# Patient Record
Sex: Female | Born: 1950 | Race: White | Hispanic: No | Smoking: Never smoker
Health system: Southern US, Community
[De-identification: ages and names within clinical notes are randomized; demographics above are authoritative.]

## PROBLEM LIST (undated history)

## (undated) DIAGNOSIS — D649 Anemia, unspecified: Secondary | ICD-10-CM

## (undated) DIAGNOSIS — D696 Thrombocytopenia, unspecified: Secondary | ICD-10-CM

## (undated) DIAGNOSIS — E119 Type 2 diabetes mellitus without complications: Secondary | ICD-10-CM

## (undated) DIAGNOSIS — S22000A Wedge compression fracture of unspecified thoracic vertebra, initial encounter for closed fracture: Secondary | ICD-10-CM

## (undated) DIAGNOSIS — M1990 Unspecified osteoarthritis, unspecified site: Secondary | ICD-10-CM

## (undated) DIAGNOSIS — M810 Age-related osteoporosis without current pathological fracture: Secondary | ICD-10-CM

---

## 2016-08-03 ENCOUNTER — Emergency Department (HOSPITAL_COMMUNITY): Payer: Self-pay

## 2016-08-03 ENCOUNTER — Observation Stay (HOSPITAL_COMMUNITY)
Admission: EM | Admit: 2016-08-03 | Discharge: 2016-08-04 | Disposition: A | Discharge status: Home / Self Care | Payer: Self-pay | Attending: Family Medicine | Admitting: Family Medicine

## 2016-08-03 ENCOUNTER — Encounter (HOSPITAL_COMMUNITY): Payer: Self-pay | Admitting: Emergency Medicine

## 2016-08-03 DIAGNOSIS — Z79899 Other long term (current) drug therapy: Secondary | ICD-10-CM | POA: Insufficient documentation

## 2016-08-03 DIAGNOSIS — D509 Iron deficiency anemia, unspecified: Secondary | ICD-10-CM | POA: Insufficient documentation

## 2016-08-03 DIAGNOSIS — M810 Age-related osteoporosis without current pathological fracture: Secondary | ICD-10-CM

## 2016-08-03 DIAGNOSIS — E119 Type 2 diabetes mellitus without complications: Secondary | ICD-10-CM

## 2016-08-03 DIAGNOSIS — R42 Dizziness and giddiness: Secondary | ICD-10-CM

## 2016-08-03 DIAGNOSIS — E538 Deficiency of other specified B group vitamins: Secondary | ICD-10-CM | POA: Insufficient documentation

## 2016-08-03 DIAGNOSIS — M1990 Unspecified osteoarthritis, unspecified site: Secondary | ICD-10-CM | POA: Insufficient documentation

## 2016-08-03 DIAGNOSIS — M4854XA Collapsed vertebra, not elsewhere classified, thoracic region, initial encounter for fracture: Secondary | ICD-10-CM

## 2016-08-03 DIAGNOSIS — Z7952 Long term (current) use of systemic steroids: Secondary | ICD-10-CM | POA: Insufficient documentation

## 2016-08-03 DIAGNOSIS — K219 Gastro-esophageal reflux disease without esophagitis: Secondary | ICD-10-CM | POA: Insufficient documentation

## 2016-08-03 DIAGNOSIS — D693 Immune thrombocytopenic purpura: Secondary | ICD-10-CM | POA: Insufficient documentation

## 2016-08-03 DIAGNOSIS — R51 Headache: Secondary | ICD-10-CM | POA: Insufficient documentation

## 2016-08-03 DIAGNOSIS — Z7984 Long term (current) use of oral hypoglycemic drugs: Secondary | ICD-10-CM | POA: Insufficient documentation

## 2016-08-03 DIAGNOSIS — R079 Chest pain, unspecified: Principal | ICD-10-CM | POA: Diagnosis present

## 2016-08-03 DIAGNOSIS — M659 Synovitis and tenosynovitis, unspecified: Secondary | ICD-10-CM

## 2016-08-03 DIAGNOSIS — M65949 Unspecified synovitis and tenosynovitis, unspecified hand: Secondary | ICD-10-CM

## 2016-08-03 DIAGNOSIS — D696 Thrombocytopenia, unspecified: Secondary | ICD-10-CM

## 2016-08-03 DIAGNOSIS — D649 Anemia, unspecified: Secondary | ICD-10-CM

## 2016-08-03 HISTORY — DX: Wedge compression fracture of unspecified thoracic vertebra, initial encounter for closed fracture: S22.000A

## 2016-08-03 HISTORY — DX: Anemia, unspecified: D64.9

## 2016-08-03 HISTORY — DX: Unspecified osteoarthritis, unspecified site: M19.90

## 2016-08-03 HISTORY — DX: Thrombocytopenia, unspecified: D69.6

## 2016-08-03 HISTORY — DX: Age-related osteoporosis without current pathological fracture: M81.0

## 2016-08-03 HISTORY — DX: Type 2 diabetes mellitus without complications: E11.9

## 2016-08-03 LAB — URINALYSIS, ROUTINE W REFLEX MICROSCOPIC
BILIRUBIN URINE: NEGATIVE
GLUCOSE, UA: NEGATIVE mg/dL
HGB URINE DIPSTICK: NEGATIVE
Ketones, ur: NEGATIVE mg/dL
Leukocytes, UA: NEGATIVE
Nitrite: NEGATIVE
PROTEIN: NEGATIVE mg/dL
Specific Gravity, Urine: 1.006 (ref 1.005–1.030)
pH: 8 (ref 5.0–8.0)

## 2016-08-03 LAB — BASIC METABOLIC PANEL
Anion gap: 10 (ref 5–15)
BUN: 12 mg/dL (ref 6–20)
CALCIUM: 9.4 mg/dL (ref 8.9–10.3)
CHLORIDE: 102 mmol/L (ref 101–111)
CO2: 23 mmol/L (ref 22–32)
CREATININE: 0.59 mg/dL (ref 0.44–1.00)
GFR calc Af Amer: >60 mL/min (ref >60)
GFR calc non Af Amer: >60 mL/min (ref >60)
Glucose, Bld: 119 mg/dL — ABNORMAL HIGH (ref 65–99)
Potassium: 4 mmol/L (ref 3.5–5.1)
Sodium: 135 mmol/L (ref 135–145)

## 2016-08-03 LAB — CBC
HCT: 36.5 % (ref 36.0–46.0)
Hemoglobin: 11.4 g/dL — ABNORMAL LOW (ref 12.0–15.0)
MCH: 22.4 pg — AB (ref 26.0–34.0)
MCHC: 31.2 g/dL (ref 30.0–36.0)
MCV: 71.9 fL — AB (ref 78.0–100.0)
PLATELETS: 102 10*3/uL — AB (ref 150–400)
RBC: 5.08 MIL/uL (ref 3.87–5.11)
RDW: 17.1 % — AB (ref 11.5–15.5)
WBC: 8.2 10*3/uL (ref 4.0–10.5)

## 2016-08-03 IMAGING — DX DG CHEST 2V
2 series · 2 of 2 positions shown · non-contrast
Comparison: None.

CLINICAL DATA: Chest pain nausea and dizziness

EXAM:
CHEST  2 VIEW

[chest pa]
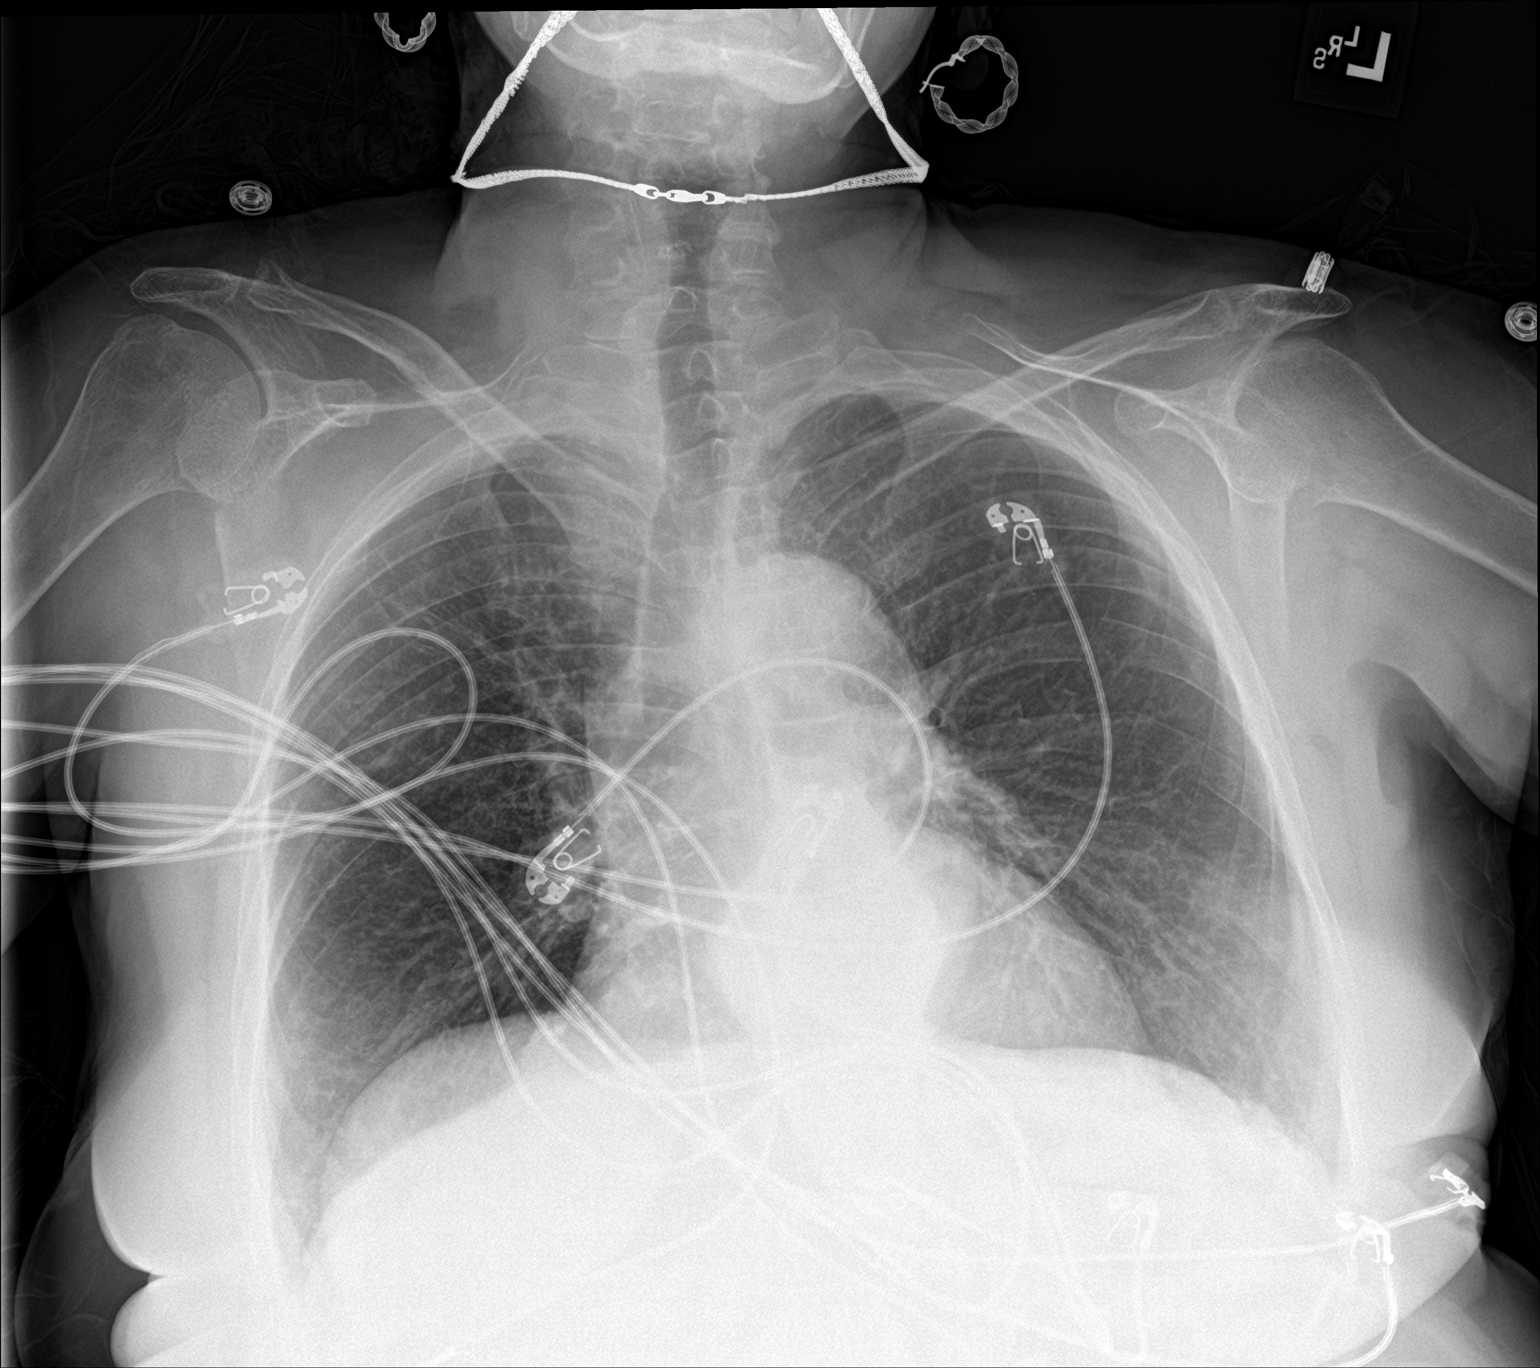

[chest lat]
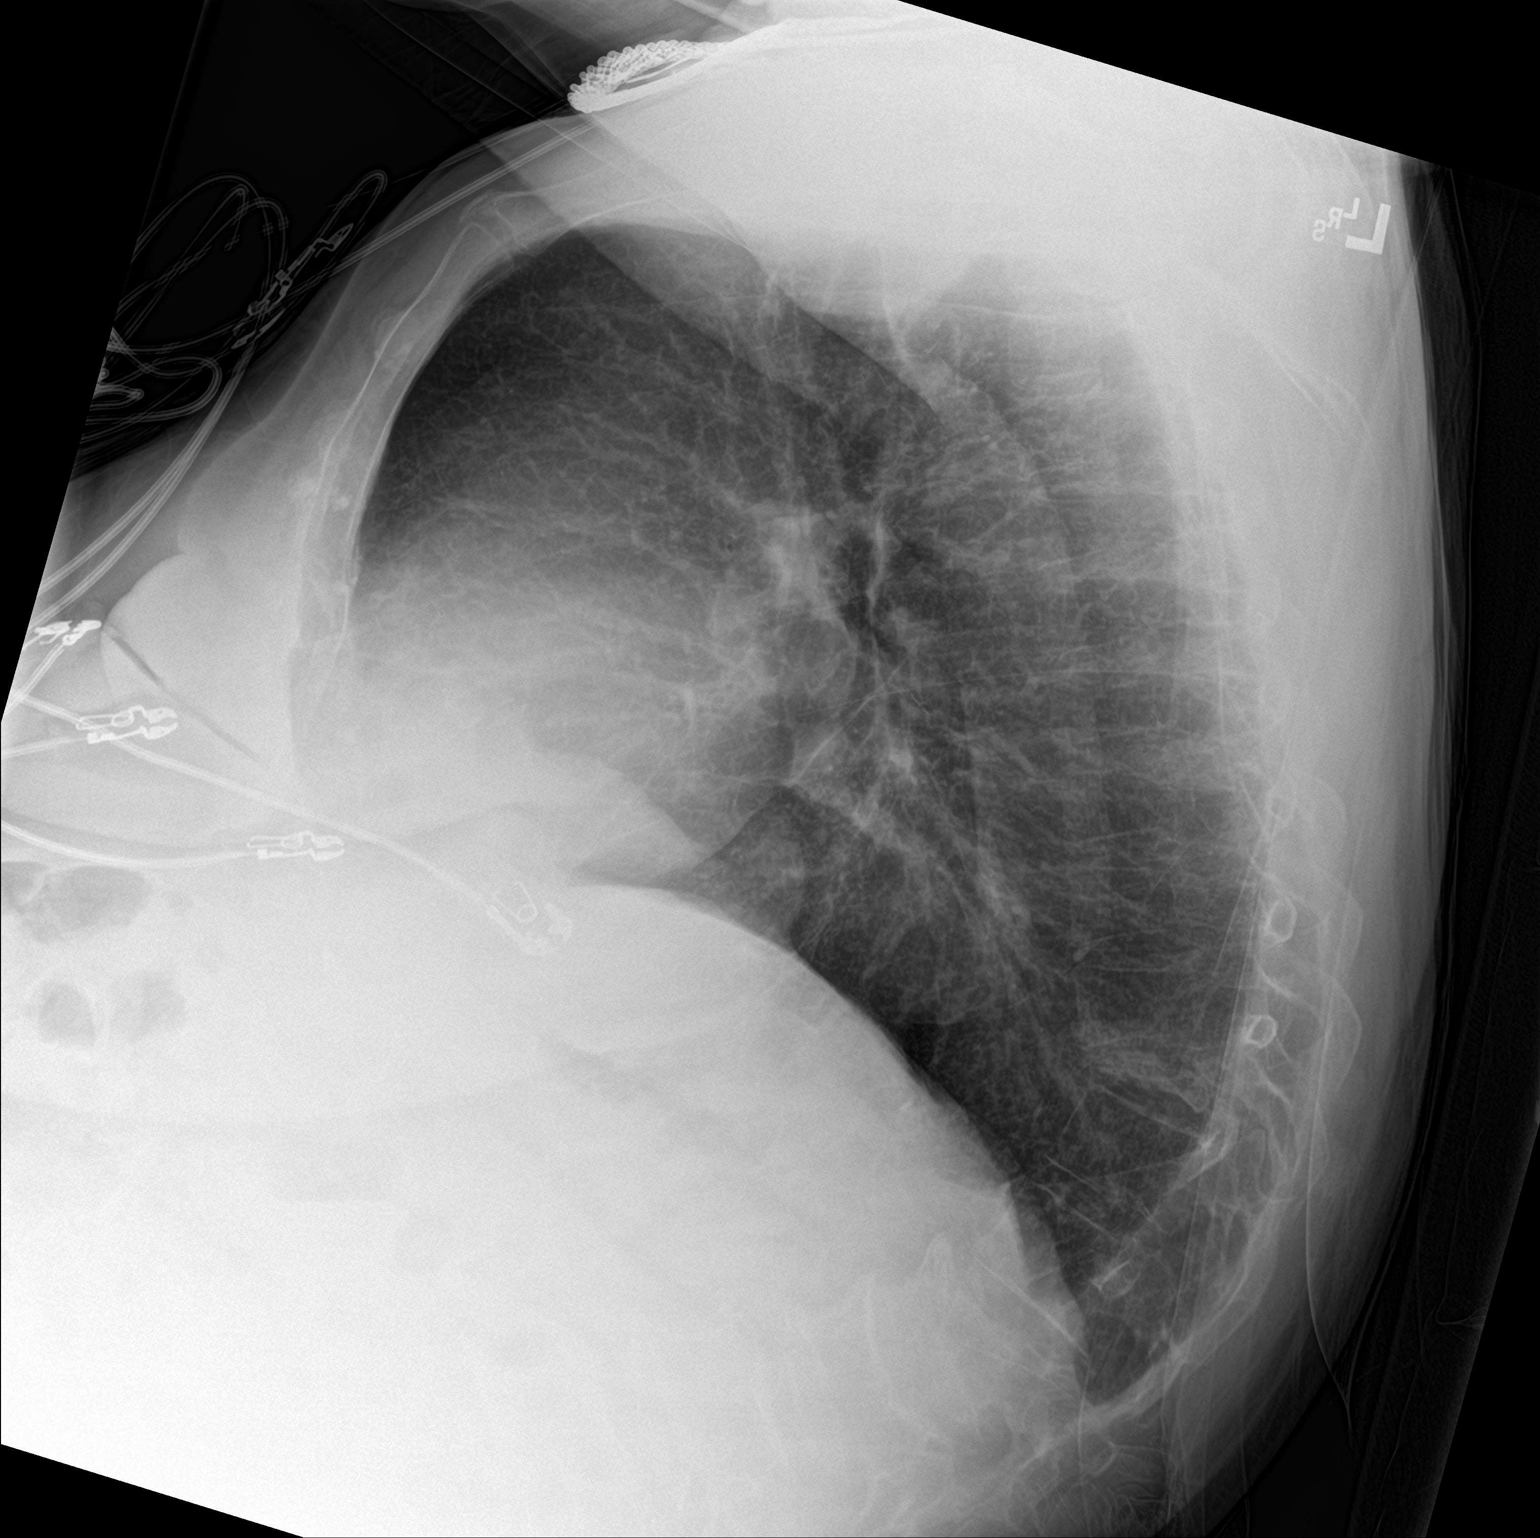

[2 of 2 positions shown; findings below may reference images not displayed]

FINDINGS: Mildly low lung volumes. No focal consolidation or pleural effusion.
Heart size upper normal. Mildly tortuous aorta with atherosclerosis.
No pneumothorax. Possible of hiatal hernia.

Osteopenia. Kyphosis of the spine with severe compression fracture
of lower thoracic vertebra and multiple compression deformities of
upper lumbar vertebra.
IMPRESSION: 1. No acute infiltrate or edema
2. Osteopenia with multiple moderate severe compression fractures of
the lower thoracic and upper lumbar spine.

## 2016-08-03 MED ORDER — ACETAMINOPHEN 500 MG PO TABS
1000.0000 mg | ORAL_TABLET | Freq: Once | ORAL | Status: AC
  Administered 2016-08-03: 1000 mg via ORAL
  Filled 2016-08-03: qty 2

## 2016-08-03 MED ORDER — NITROGLYCERIN 2 % TD OINT
1.0000 [in_us] | TOPICAL_OINTMENT | Freq: Once | TRANSDERMAL | Status: AC
  Administered 2016-08-04: 1 [in_us] via TOPICAL
  Filled 2016-08-03: qty 1

## 2016-08-03 NOTE — ED Provider Notes (Addendum)
[facility]-EMERGENCY DEPT Provider Note   CSN: [id number] Arrival date & time: 08/03/16  1856     History   Chief Complaint Chief Complaint  Patient presents with  . Chest Pain  . Emesis  . Dizziness    HPI Kim Larson is a 66 y.o. female.  The history is provided by the patient.  Chest Pain   This is a recurrent problem. The current episode started 6 to 12 hours ago. Episode frequency: intermittent. The problem has not changed since onset.The pain is present in the substernal region. The pain is moderate. The quality of the pain is described as pressure-like. The pain does not radiate. Associated symptoms include dizziness, malaise/fatigue, nausea and shortness of breath. Pertinent negatives include no abdominal pain, no back pain, no cough, no fever, no irregular heartbeat and no lower extremity edema. She has tried nothing for the symptoms. Risk factors include obesity.  Her past medical history is significant for diabetes.  Pertinent negatives for past medical history include no CAD, no DVT, no hyperlipidemia, no hypertension, no MI, no PE and no strokes.    Past Medical History:  Diagnosis Date  . Diabetes mellitus without complication (HCC)     There are no active problems to display for this patient.   History reviewed. No pertinent surgical history.  OB History    No data available       Home Medications    Prior to Admission medications   Medication Sig Start Date End Date Taking? Authorizing Provider  calcium carbonate (TUMS - DOSED IN MG ELEMENTAL CALCIUM) 500 MG chewable tablet Chew 1 tablet by mouth 2 (two) times daily.   Yes [provider]  GLIMEPIRIDE PO Take 1.5 mg by mouth daily.   Yes [provider]  metFORMIN (GLUCOPHAGE) 850 MG tablet Take 850 mg by mouth 2 (two) times daily with a meal.   Yes [provider]  omeprazole (PRILOSEC) 20 MG capsule Take 20 mg by mouth daily.   Yes [provider]  predniSONE  (DELTASONE) 5 MG tablet Take 5 mg by mouth daily with breakfast.   Yes [provider]  PRESCRIPTION MEDICATION Take 5 mLs by mouth 2 (two) times daily. [place] 15%   Yes [provider]    Family History History reviewed. No pertinent family history.  Social History Social History  Substance Use Topics  . Smoking status: Never Smoker  . Smokeless tobacco: Never Used  . Alcohol use No     Allergies   Patient has no known allergies.   Review of Systems Review of Systems  Constitutional: Positive for malaise/fatigue. Negative for fever.  Respiratory: Positive for shortness of breath. Negative for cough.   Cardiovascular: Positive for chest pain.  Gastrointestinal: Positive for nausea. Negative for abdominal pain.  Musculoskeletal: Negative for back pain.  Neurological: Positive for dizziness.   All other systems are reviewed and are negative for acute change except as noted in the HPI   Physical Exam Updated Vital Signs BP 135/69   Pulse 78   Temp 99.6 F (37.6 C) (Oral)   Resp (!) 21   SpO2 96%   Physical Exam  Constitutional: She is oriented to person, place, and time. She appears well-developed and well-nourished. No distress.  HENT:  Head: Normocephalic and atraumatic.  Nose: Nose normal.  Eyes: Conjunctivae and EOM are normal. Pupils are equal, round, and reactive to light. Right eye exhibits no discharge. Left eye exhibits no discharge. No scleral icterus.  Neck:  Normal range of motion. Neck supple.  Cardiovascular: Normal rate and regular rhythm.  Exam reveals no gallop and no friction rub.   No murmur heard. Pulmonary/Chest: Effort normal and breath sounds normal. No stridor. No respiratory distress. She has no rales.  Abdominal: Soft. She exhibits no distension. There is no tenderness.  Musculoskeletal: She exhibits no edema or tenderness.  Neurological: She is alert and oriented to person, place, and time.  Skin: Skin is warm and dry. No  rash noted. She is not diaphoretic. No erythema.  Psychiatric: She has a normal mood and affect.  Vitals reviewed.    ED Treatments / Results  Labs (all labs ordered are listed, but only abnormal results are displayed) Labs Reviewed  BASIC METABOLIC PANEL - Abnormal; Notable for the following:       Result Value   Glucose, Bld 119 (*)    All other components within normal limits  CBC - Abnormal; Notable for the following:    Hemoglobin 11.4 (*)    MCV 71.9 (*)    MCH 22.4 (*)    RDW 17.1 (*)    Platelets 102 (*)    All other components within normal limits  URINALYSIS, ROUTINE W REFLEX MICROSCOPIC - Abnormal; Notable for the following:    Color, Urine STRAW (*)    All other components within normal limits  D-DIMER, QUANTITATIVE (NOT AT [facility])  CBG MONITORING, ED  Rosezena Sensor, ED    EKG  EKG Interpretation  Date/Time:  Tuesday Aug 03 2016 19:34:32 EDT Ventricular Rate:  106 PR Interval:  162 QRS Duration: 106 QT Interval:  342 QTC Calculation: 454 R Axis:   -74 Text Interpretation:  Sinus tachycardia Incomplete right bundle branch block Left anterior fascicular block Septal infarct , age undetermined Abnormal ECG NO STEMI   no prior tracing  Confirmed by [facility] MD, Elzie Knisley [phone]) on 08/03/2016 10:42:21 PM       Radiology Dg Chest 2 View  Result Date: 08/03/2016 CLINICAL DATA:  Chest pain nausea and dizziness EXAM: CHEST  2 VIEW COMPARISON:  None. FINDINGS: Mildly low lung volumes. No focal consolidation or pleural effusion. Heart size upper normal. Mildly tortuous aorta with atherosclerosis. No pneumothorax. Possible of hiatal hernia. Osteopenia. Kyphosis of the spine with severe compression fracture of lower thoracic vertebra and multiple compression deformities of upper lumbar vertebra. IMPRESSION: 1. No acute infiltrate or edema 2. Osteopenia with multiple moderate severe compression fractures of the lower thoracic and upper lumbar spine. Electronically Signed    By: Jasmine Pang M.D.   On: 08/03/2016 23:26    Procedures Procedures (including critical care time)  Medications Ordered in ED Medications  acetaminophen (TYLENOL) tablet 1,000 mg (not administered)     Initial Impression / Assessment and Plan / ED Course  I have reviewed the triage vital signs and the nursing notes.  Pertinent labs & imaging results that were available during my care of the patient were reviewed by me and considered in my medical decision making (see chart for details).     Atypical chest pain. EKG without acute ischemic changes or evidence of pericarditis. Patient does have nonspecific, age indeterminate septal changes. No prior EKG for comparison.  HEAR >3. Initial troponin negative. She will require admission for ACS rule out if other work up is negative. Provided with NTG. 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 Holding heparin for the time being.   Unable to [facility] due to tachycardia with right axis changes, recent  trip, and age. But low pretest probability. Dimer negative. Doubt PE.  Chest x-ray without evidence suggestive of pneumonia, pneumothorax, pneumomediastinum.  No abnormal contour of the mediastinum to suggest dissection. No evidence of acute injuries.  Presentation not classic for aortic dissection or esophageal perforation.  Will discuss case with hospitalist for admission.  Final Clinical Impressions(s) / ED Diagnoses   Final diagnoses:  Chest pain      Natonya Finstad, Amadeo Garnet, MD 08/04/16 0040

## 2016-08-03 NOTE — ED Triage Notes (Signed)
Pt presents with dizziness and emesis for last couple days and worse today; pt denies abd pain but reports generalized body aches, "achy bones"; pt denies diarrhea; denies syncope; pt reports room spinning, seeing double dizziness

## 2016-08-04 ENCOUNTER — Encounter (HOSPITAL_COMMUNITY): Payer: Self-pay | Admitting: Internal Medicine

## 2016-08-04 ENCOUNTER — Observation Stay (HOSPITAL_COMMUNITY): Payer: Self-pay

## 2016-08-04 DIAGNOSIS — R079 Chest pain, unspecified: Secondary | ICD-10-CM | POA: Diagnosis present

## 2016-08-04 DIAGNOSIS — D696 Thrombocytopenia, unspecified: Secondary | ICD-10-CM

## 2016-08-04 DIAGNOSIS — R42 Dizziness and giddiness: Secondary | ICD-10-CM

## 2016-08-04 DIAGNOSIS — E119 Type 2 diabetes mellitus without complications: Secondary | ICD-10-CM

## 2016-08-04 DIAGNOSIS — M4854XA Collapsed vertebra, not elsewhere classified, thoracic region, initial encounter for fracture: Secondary | ICD-10-CM

## 2016-08-04 DIAGNOSIS — M659 Synovitis and tenosynovitis, unspecified: Secondary | ICD-10-CM

## 2016-08-04 DIAGNOSIS — M810 Age-related osteoporosis without current pathological fracture: Secondary | ICD-10-CM

## 2016-08-04 DIAGNOSIS — D649 Anemia, unspecified: Secondary | ICD-10-CM

## 2016-08-04 LAB — TROPONIN I: Troponin I: <0.03 ng/mL (ref <0.03)

## 2016-08-04 LAB — HEPATIC FUNCTION PANEL
ALBUMIN: 3.6 g/dL (ref 3.5–5.0)
ALK PHOS: 46 U/L (ref 38–126)
ALT: 13 U/L — AB (ref 14–54)
AST: 20 U/L (ref 15–41)
Bilirubin, Direct: 0.1 mg/dL (ref 0.1–0.5)
Indirect Bilirubin: 0.5 mg/dL (ref 0.3–0.9)
TOTAL PROTEIN: 6.6 g/dL (ref 6.5–8.1)
Total Bilirubin: 0.6 mg/dL (ref 0.3–1.2)

## 2016-08-04 LAB — LIPID PANEL
Cholesterol: 143 mg/dL (ref 0–200)
HDL: 37 mg/dL — AB (ref >40)
LDL CALC: 81 mg/dL (ref 0–99)
Total CHOL/HDL Ratio: 3.9 RATIO
Triglycerides: 123 mg/dL (ref <150)
VLDL: 25 mg/dL (ref 0–40)

## 2016-08-04 LAB — TSH: TSH: 0.774 u[IU]/mL (ref 0.350–4.500)

## 2016-08-04 LAB — GLUCOSE, CAPILLARY: GLUCOSE-CAPILLARY: 121 mg/dL — AB (ref 65–99)

## 2016-08-04 LAB — IRON AND TIBC
Iron: 25 ug/dL — ABNORMAL LOW (ref 28–170)
Saturation Ratios: 7 % — ABNORMAL LOW (ref 10.4–31.8)
TIBC: 381 ug/dL (ref 250–450)
UIBC: 356 ug/dL

## 2016-08-04 LAB — CBG MONITORING, ED: Glucose-Capillary: 94 mg/dL (ref 65–99)

## 2016-08-04 LAB — FOLATE: FOLATE: 20 ng/mL (ref >5.9)

## 2016-08-04 LAB — C-REACTIVE PROTEIN: CRP: <0.8 mg/dL (ref <1.0)

## 2016-08-04 LAB — D-DIMER, QUANTITATIVE: D-Dimer, Quant: 0.45 ug/mL-FEU (ref 0.00–0.50)

## 2016-08-04 LAB — RETICULOCYTES
RBC.: 4.91 MIL/uL (ref 3.87–5.11)
RETIC COUNT ABSOLUTE: 44.2 10*3/uL (ref 19.0–186.0)
Retic Ct Pct: 0.9 % (ref 0.4–3.1)

## 2016-08-04 LAB — I-STAT TROPONIN, ED: Troponin i, poc: 0 ng/mL (ref 0.00–0.08)

## 2016-08-04 LAB — VITAMIN B12: VITAMIN B 12: 146 pg/mL — AB (ref 180–914)

## 2016-08-04 LAB — FERRITIN: Ferritin: 7 ng/mL — ABNORMAL LOW (ref 11–307)

## 2016-08-04 LAB — SEDIMENTATION RATE: Sed Rate: 26 mm/hr — ABNORMAL HIGH (ref 0–22)

## 2016-08-04 MED ORDER — TECHNETIUM TC 99M TETROFOSMIN IV KIT
30.0000 | PACK | Freq: Once | INTRAVENOUS | Status: AC | PRN
  Administered 2016-08-04: 30 via INTRAVENOUS

## 2016-08-04 MED ORDER — PREDNISONE 5 MG PO TABS
5.0000 mg | ORAL_TABLET | Freq: Every day | ORAL | Status: DC
  Filled 2016-08-04: qty 1

## 2016-08-04 MED ORDER — SODIUM CHLORIDE 0.9 % IV SOLN
510.0000 mg | Freq: Once | INTRAVENOUS | Status: AC
  Administered 2016-08-04: 510 mg via INTRAVENOUS
  Filled 2016-08-04: qty 17

## 2016-08-04 MED ORDER — PANTOPRAZOLE SODIUM 40 MG PO TBEC
40.0000 mg | DELAYED_RELEASE_TABLET | Freq: Every day | ORAL | Status: DC
  Administered 2016-08-04: 40 mg via ORAL
  Filled 2016-08-04: qty 1

## 2016-08-04 MED ORDER — VITAMIN B-12 1000 MCG PO TABS: 1000.0000 ug | ORAL_TABLET | Freq: Every day | ORAL | 0 refills | Status: AC

## 2016-08-04 MED ORDER — MORPHINE SULFATE (PF) 4 MG/ML IV SOLN
2.0000 mg | Freq: Once | INTRAVENOUS | Status: AC
  Administered 2016-08-04: 2 mg via INTRAVENOUS
  Filled 2016-08-04: qty 1

## 2016-08-04 MED ORDER — CYANOCOBALAMIN 1000 MCG/ML IJ SOLN: 1000.0000 ug | Freq: Every day | INTRAMUSCULAR | Status: DC

## 2016-08-04 MED ORDER — NITROGLYCERIN 2 % TD OINT
0.5000 [in_us] | TOPICAL_OINTMENT | Freq: Four times a day (QID) | TRANSDERMAL | Status: DC
  Administered 2016-08-04: 0.5 [in_us] via TOPICAL
  Filled 2016-08-04: qty 1

## 2016-08-04 MED ORDER — ACETAMINOPHEN 325 MG PO TABS: 650.0000 mg | ORAL_TABLET | ORAL | Status: DC | PRN

## 2016-08-04 MED ORDER — ONDANSETRON HCL 4 MG/2ML IJ SOLN: 4.0000 mg | Freq: Four times a day (QID) | INTRAMUSCULAR | Status: DC | PRN

## 2016-08-04 MED ORDER — INSULIN ASPART 100 UNIT/ML ~~LOC~~ SOLN: 0.0000 [IU] | Freq: Three times a day (TID) | SUBCUTANEOUS | Status: DC

## 2016-08-04 MED ORDER — REGADENOSON 0.4 MG/5ML IV SOLN
0.4000 mg | Freq: Once | INTRAVENOUS | Status: AC
  Administered 2016-08-04: 0.4 mg via INTRAVENOUS
  Filled 2016-08-04: qty 5

## 2016-08-04 MED ORDER — REGADENOSON 0.4 MG/5ML IV SOLN
INTRAVENOUS | Status: AC
  Filled 2016-08-04: qty 5

## 2016-08-04 MED ORDER — MORPHINE SULFATE (PF) 4 MG/ML IV SOLN: 2.0000 mg | INTRAVENOUS | Status: DC | PRN

## 2016-08-04 MED ORDER — TECHNETIUM TC 99M TETROFOSMIN IV KIT
10.0000 | PACK | Freq: Once | INTRAVENOUS | Status: AC | PRN
  Administered 2016-08-04: 10 via INTRAVENOUS

## 2016-08-04 MED ORDER — CALCIUM CARBONATE ANTACID 500 MG PO CHEW
1.0000 | CHEWABLE_TABLET | Freq: Two times a day (BID) | ORAL | Status: DC
  Filled 2016-08-04 (×2): qty 1

## 2016-08-04 MED ORDER — NITROGLYCERIN 2 % TD OINT: 0.5000 [in_us] | TOPICAL_OINTMENT | Freq: Four times a day (QID) | TRANSDERMAL | Status: DC

## 2016-08-04 MED ORDER — FERROUS SULFATE 325 (65 FE) MG PO TABS: 325.0000 mg | ORAL_TABLET | Freq: Every day | ORAL | 0 refills | Status: AC

## 2016-08-04 NOTE — ED Notes (Signed)
Pt CBG was 94, notified Mildred(RN)

## 2016-08-04 NOTE — Consult Note (Addendum)
Kim Larson is an 66 y.o. female.   Chief Complaint: Chest pain HPI: Kim Larson  is a 66 y.o. female  With history of type 2 diabetes mellitus, osteoporosis, ITP, iron deficiency anemia, who is from [place], came to [place] in April 2018. She is admitted to the hospital with chest pain.  Patient states that over the past 1 year she has been having chest pain in the middle of chest that comes with or without exertion activity lasting for anywhere from 30 minutes to an hour sometimes associated with diaphoresis. This has been stable however last night she had severe chest pain that was much more intense associated with marked generalized weakness and diaphoresis and nausea and she vomited once. Hence the presented to the emergency room. She has been ruled out for myocardial infarction, but due to her cardiovascular risk factors she was admitted to the hospital for further evaluation. 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  Due to bed situation, patient is still in the emergency room. She has not had any recurrence of chest pain. Her son is present at the bedside who translates for me, patient does not speak any [place].  No abdominal discomfort, no bloody stools or melena, no headaches or visual disturbances, denies any symptoms of claudication or TIA. She is a nonsmoker, does not drink alcohol. No recent weight changes.  Past Medical History:  Diagnosis Date  . Anemia   . Compression fracture of body of thoracic vertebra (HCC)   . Diabetes mellitus without complication ([place])   . Osteoarthritis   . Osteoporosis   . Thrombocytopenia ([place])     History reviewed. No pertinent surgical history.  History reviewed. There is no premature coronary artery disease in the family.   Social History:  reports that she has never smoked. She has never used smokeless tobacco. She reports that she does not drink alcohol or use drugs.  Allergies: No Known Allergies  Review of  Systems - generalized aches in her feet and body, states that this is due to low calcium. Generalized weakness present. Occasional dizziness. Chest pain present. No dyspnea. No hemoptysis. No bleeding diathesis. Occasional dizziness, no syncope.  Other systems negative.    Blood pressure (!) 131/58, pulse 69, temperature 98.9 F (37.2 C), resp. rate 17, SpO2 97 %. There is no height or weight on file to calculate BMI.  General appearance: alert, cooperative, appears stated age and no distress Eyes: negative findings: lids and lashes normal Neck: no adenopathy, no carotid bruit, no JVD, supple, symmetrical, trachea midline and thyroid not enlarged, symmetric, no tenderness/mass/nodules Neck: JVP - normal, carotids 2+= without bruits Resp: clear to auscultation bilaterally Chest wall: no tenderness Cardio: regular rate and rhythm, S1, S2 normal, no murmur, click, rub or gallop GI: soft, non-tender; bowel sounds normal; no masses,  no organomegaly Extremities: extremities normal, atraumatic, no cyanosis or edema Pulses: 2+ and symmetric Skin: Skin color, texture, turgor normal. No rashes or lesions Neurologic: Grossly normal  Results for orders placed or performed during the hospital encounter of 08/03/16 (from the past 48 hour(s))  Urinalysis, Routine w reflex microscopic     Status: Abnormal   Collection Time: 08/03/16  7:30 PM  Result Value Ref Range   Color, Urine STRAW (A) YELLOW   APPearance CLEAR CLEAR   Specific Gravity, Urine 1.006 1.005 - 1.030   pH 8.0 5.0 - 8.0   Glucose, UA NEGATIVE NEGATIVE mg/dL   Hgb urine dipstick NEGATIVE NEGATIVE   Bilirubin Urine  NEGATIVE NEGATIVE   Ketones, ur NEGATIVE NEGATIVE mg/dL   Protein, ur NEGATIVE NEGATIVE mg/dL   Nitrite NEGATIVE NEGATIVE   Leukocytes, UA NEGATIVE NEGATIVE  Basic metabolic panel     Status: Abnormal   Collection Time: 08/03/16  7:43 PM  Result Value Ref Range   Sodium 135 135 - 145 mmol/L   Potassium 4.0 3.5 - 5.1  mmol/L   Chloride 102 101 - 111 mmol/L   CO2 23 22 - 32 mmol/L   Glucose, Bld 119 (H) 65 - 99 mg/dL   BUN 12 6 - 20 mg/dL   Creatinine, Ser 0.59 0.44 - 1.00 mg/dL   Calcium 9.4 8.9 - 10.3 mg/dL   GFR calc non Af Amer >60 >60 mL/min   GFR calc Af Amer >60 >60 mL/min    Comment: (NOTE) The eGFR has been calculated using the CKD EPI equation. This calculation has not been validated in all clinical situations. eGFR's persistently <60 mL/min signify possible Chronic Kidney Disease.    Anion gap 10 5 - 15  CBC     Status: Abnormal   Collection Time: 08/03/16  7:43 PM  Result Value Ref Range   WBC 8.2 4.0 - 10.5 K/uL   RBC 5.08 3.87 - 5.11 MIL/uL   Hemoglobin 11.4 (L) 12.0 - 15.0 g/dL   HCT 36.5 36.0 - 46.0 %   MCV 71.9 (L) 78.0 - 100.0 fL   MCH 22.4 (L) 26.0 - 34.0 pg   MCHC 31.2 30.0 - 36.0 g/dL   RDW 17.1 (H) 11.5 - 15.5 %   Platelets 102 (L) 150 - 400 K/uL    Comment: REPEATED TO VERIFY SPECIMEN CHECKED FOR CLOTS PLATELETS APPEAR DECREASED PLATELET COUNT CONFIRMED BY SMEAR   D-dimer, quantitative (not at [facility])     Status: None   Collection Time: 08/03/16 11:50 PM  Result Value Ref Range   D-Dimer, Quant 0.45 0.00 - 0.50 ug/mL-FEU    Comment: (NOTE) At the manufacturer cut-off of 0.50 ug/mL FEU, this assay has been documented to exclude PE with a sensitivity and negative predictive value of 97 to 99%.  At this time, this assay has not been approved by the FDA to exclude DVT/VTE. Results should be correlated with clinical presentation.   I-Stat Troponin, ED (not at [facility])     Status: None   Collection Time: 08/04/16 12:00 AM  Result Value Ref Range   Troponin i, poc 0.00 0.00 - 0.08 ng/mL   Comment 3            Comment: Due to the release kinetics of cTnI, a negative result within the first hours of the onset of symptoms does not rule out myocardial infarction with certainty. If myocardial infarction is still suspected, repeat the test at appropriate intervals.    Hepatic function panel     Status: Abnormal   Collection Time: 08/04/16  1:52 AM  Result Value Ref Range   Total Protein 6.6 6.5 - 8.1 g/dL   Albumin 3.6 3.5 - 5.0 g/dL   AST 20 15 - 41 U/L   ALT 13 (L) 14 - 54 U/L   Alkaline Phosphatase 46 38 - 126 U/L   Total Bilirubin 0.6 0.3 - 1.2 mg/dL   Bilirubin, Direct 0.1 0.1 - 0.5 mg/dL   Indirect Bilirubin 0.5 0.3 - 0.9 mg/dL  Sedimentation rate     Status: Abnormal   Collection Time: 08/04/16  1:52 AM  Result Value Ref Range   Sed Rate 26 (H)  0 - 22 mm/hr  C-reactive protein     Status: None   Collection Time: 08/04/16  1:52 AM  Result Value Ref Range   CRP <0.8 <1.0 mg/dL  TSH     Status: None   Collection Time: 08/04/16  1:52 AM  Result Value Ref Range   TSH 0.774 0.350 - 4.500 uIU/mL    Comment: Performed by a 3rd Generation assay with a functional sensitivity of <=0.01 uIU/mL.  Vitamin B12     Status: Abnormal   Collection Time: 08/04/16  1:52 AM  Result Value Ref Range   Vitamin B-12 146 (L) 180 - 914 pg/mL    Comment: (NOTE) This assay is not validated for testing neonatal or myeloproliferative syndrome specimens for Vitamin B12 levels.   Folate     Status: None   Collection Time: 08/04/16  1:52 AM  Result Value Ref Range   Folate 20.0 >5.9 ng/mL  Iron and TIBC     Status: Abnormal   Collection Time: 08/04/16  1:52 AM  Result Value Ref Range   Iron 25 (L) 28 - 170 ug/dL   TIBC 381 250 - 450 ug/dL   Saturation Ratios 7 (L) 10.4 - 31.8 %   UIBC 356 ug/dL  Ferritin     Status: Abnormal   Collection Time: 08/04/16  1:52 AM  Result Value Ref Range   Ferritin 7 (L) 11 - 307 ng/mL  Reticulocytes     Status: None   Collection Time: 08/04/16  1:52 AM  Result Value Ref Range   Retic Ct Pct 0.9 0.4 - 3.1 %   RBC. 4.91 3.87 - 5.11 MIL/uL   Retic Count, Manual 44.2 19.0 - 186.0 K/uL  Troponin I (q 6hr x 3)     Status: None   Collection Time: 08/04/16  1:52 AM  Result Value Ref Range   Troponin I <0.03 <0.03 ng/mL   Troponin I (q 6hr x 3)     Status: None   Collection Time: 08/04/16  7:23 AM  Result Value Ref Range   Troponin I <0.03 <0.03 ng/mL  CBG monitoring, ED     Status: None   Collection Time: 08/04/16  7:30 AM  Result Value Ref Range   Glucose-Capillary 94 65 - 99 mg/dL   Comment 1 Notify RN    Comment 2 Document in Chart     Labs:   Lab Results  Component Value Date   WBC 8.2 08/03/2016   HGB 11.4 (L) 08/03/2016   HCT 36.5 08/03/2016   MCV 71.9 (L) 08/03/2016   PLT 102 (L) 08/03/2016    Recent Labs Lab 08/03/16 1943 08/04/16 0152  NA 135  --   K 4.0  --   CL 102  --   CO2 23  --   BUN 12  --   CREATININE 0.59  --   CALCIUM 9.4  --   PROT  --  6.6  BILITOT  --  0.6  ALKPHOS  --  46  ALT  --  13*  AST  --  20  GLUCOSE 119*  --     Lipid Panel  No results found for: CHOL, TRIG, HDL, CHOLHDL, VLDL, LDLCALC  BNP (last 3 results) No results for input(s): BNP in the last 8760 hours.  HEMOGLOBIN A1C No results found for: HGBA1C, MPG  Cardiac Panel (last 3 results)  Recent Labs  08/04/16 0152 08/04/16 0723  TROPONINI <0.03 <0.03    Lab Results  Component Value  Date   TROPONINI <0.03 08/04/2016      Recent Labs  08/04/16 0152  TSH 0.774   Current Facility-Administered Medications:  .  acetaminophen (TYLENOL) tablet 650 mg, 650 mg, Oral, Q4H PRN, Lily Kocher, MD .  calcium carbonate (TUMS - dosed in mg elemental calcium) chewable tablet 200 mg of elemental calcium, 1 tablet, Oral, BID WC, Lily Kocher, MD .  cyanocobalamin ((VITAMIN B-12)) injection 1,000 mcg, 1,000 mcg, Intramuscular, Daily, Vance Gather B, MD .  insulin aspart (novoLOG) injection 0-15 Units, 0-15 Units, Subcutaneous, TID WC, Lily Kocher, MD .  morphine 4 MG/ML injection 2 mg, 2 mg, Intravenous, Q2H PRN, Lily Kocher, MD .  nitroGLYCERIN (NITROGLYN) 2 % ointment 0.5 inch, 0.5 inch, Topical, Q6H, Lily Kocher, MD .  ondansetron [facility]) injection 4 mg, 4 mg, Intravenous, Q6H PRN,  Lily Kocher, MD .  pantoprazole (PROTONIX) EC tablet 40 mg, 40 mg, Oral, Daily, Lily Kocher, MD .  predniSONE (DELTASONE) tablet 5 mg, 5 mg, Oral, Q breakfast, Lily Kocher, MD  Current Outpatient Prescriptions:  .  calcium carbonate (TUMS - DOSED IN MG ELEMENTAL CALCIUM) 500 MG chewable tablet, Chew 1 tablet by mouth 2 (two) times daily., Disp: , Rfl:  .  GLIMEPIRIDE PO, Take 1.5 mg by mouth daily., Disp: , Rfl:  .  metFORMIN (GLUCOPHAGE) 850 MG tablet, Take 850 mg by mouth 2 (two) times daily with a meal., Disp: , Rfl:  .  omeprazole (PRILOSEC) 20 MG capsule, Take 20 mg by mouth daily., Disp: , Rfl:  .  predniSONE (DELTASONE) 5 MG tablet, Take 5 mg by mouth daily with breakfast., Disp: , Rfl:  .  PRESCRIPTION MEDICATION, Take 5 mLs by mouth 2 (two) times daily. [place] 15%, Disp: , Rfl:   CARDIAC STUDIES: EKG 08/03/2016: Normal sinus rhythm/sinus tachycardia at the rate of 106 bpm, left axis deviation, no ischemia.  ECHO Pending  Assessment/Plan 1. Atypical chest pain, cannot exclude chronic stable angina pectoris in a patient with diabetes mellitus and unknown lipid status. EKG is nonischemic and no acute changes. 2. Chronic thrombocytopenia,? History of ITP.  Recommendation: Due to her risk factors, I have set her up for a Lexiscan Myoview stress test. If negative she can be discharged on with outpatient risk factor modification. Due to low platelets, would certainly avoid antiplatelet therapy. Unless testis is abnormal, I will see her back on a when necessary basis. Patient plans to return back to [place] soon. Check lipids if not done.  Adrian Prows, MD 08/04/2016, 11:28 AM [facility] Pager: [phone] Office: [phone] If no answer: Cell:  [phone]  I have personally reviewed the nuclear images, patient has very mild soft tissue attenuation artifact in the anterior wall, gut uptake artifact in the inferior wall, I do not see any reversibility. There is  no wall motion abnormality on gated images. Hence I consider this to be a low risk study. Patient can be discharged home with outpatient follow-up and follow-up with her physicians in [place] at the earliest. Discussed with Dr. Bonner Puna.

## 2016-08-04 NOTE — Progress Notes (Signed)
Discharged pt per MD order. AVS given to son and went over. Patient to home with son.  Minerva Endsiffany N Sacheen Arrasmith  RN

## 2016-08-04 NOTE — H&P (Addendum)
History and Physical    Kim CockayneMalika Larson ZOX:[id number] DOB: November 27, 1950 DOA: 08/03/2016  PCP: Patient, No Pcp Per  The patient is from [place].  Patient coming from: Her daughter's home  Chief Complaint: Chest pain, light-headedness, dizziness, emesis  HPI: Kim Larson is a 66 y.o. 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  Most of her complaints are subacute and were occurring intermittently, even before traveling to the [place] just over one month ago.  She has a substernal chest pain that she describes as feeling like she is being punched in the chest.  It does not radiate.  It is associated with shortness of breath, light-headedness, and diaphoresis.  No history of syncope.  Today she had an episode of vomiting after having chest pain, which prompted presentation to the ED.  Her dizziness is has been intermittent.  No specific trigger.  No associated tinnitus.  No associated blurred or double vision.  She has a left sided headache.  She has had pain in multiple joints and says that she is "overdue for the injection for her bones", which she receives every three months in [place].  She had isolated pain and swelling in the fourth digit on her left hand.  It was associated with decreased ROM.  It has improved today without specific intervention.  No fever, no cough or congestion, no post nasal drip.  No post nasal drip.    She has generalized weakness but she denies a history of falls.  No dysuria.   No new OTC medications.  She refuses to take ASA because of  her thrombocytopenia.  ED Course: The patient has a normal WBC count.  Hgb 11.4 (baseline unknown).  Platelet count 102.  D-Dimer negative.  EKG shows NSR; no acute ST segment changes.  Troponin negative.  Chest xray is negative for acute process but shows multiple compression fractures of the thoracic and upper lumbar spines.    She has refused ASA.  She is wearing one inch of nitropaste.  She is complaining of pain in her back.  She is eating [employer].  Hospitalist asked to admit.  Review of Systems: As per HPI otherwise 10 point review of systems negative.    Past Medical History:  Diagnosis Date  . Anemia   . Compression fracture of body of thoracic vertebra (HCC)   . Diabetes mellitus without complication (HCC)   . Osteoarthritis   . Osteoporosis   . Thrombocytopenia (HCC)     History reviewed. No pertinent surgical history. Family denies any major surgeries.   reports that she has never smoked. She has never used smokeless tobacco. She reports that she does not drink alcohol or use drugs.  She is a widow.  She has six children.  No Known Allergies  History reviewed. No pertinent family history. Diabetes is prevalent.  Prior to Admission medications   Medication Sig Start Date End Date Taking? Authorizing Provider  calcium carbonate (TUMS - DOSED IN MG ELEMENTAL CALCIUM) 500 MG chewable tablet Chew 1 tablet by mouth 2 (two) times daily.   Yes [provider]  GLIMEPIRIDE  PO Take 1.5 mg by mouth daily.   Yes [provider]  metFORMIN (GLUCOPHAGE) 850 MG tablet Take 850 mg by mouth 2 (two) times daily with a meal.   Yes [provider]  omeprazole (PRILOSEC) 20 MG capsule Take 20 mg by mouth daily.   Yes [provider]  predniSONE (DELTASONE) 5 MG tablet Take 5 mg by mouth daily with breakfast.   Yes [provider]  PRESCRIPTION MEDICATION Take 5 mLs by mouth 2 (two) times daily. [place] 15%   Yes [provider]     Physical Exam: Vitals:   08/03/16 2330 08/03/16 2345 08/04/16 0000 08/04/16 0100  BP: 130/66 (!) 156/72 131/79 128/60  Pulse: 79 86 82 75  Resp: 18 (!) 23 18 20   Temp:      TempSrc:      SpO2: 97% 97% 97% 97%      Constitutional: NAD, calm, comfortable, NONtoxic appearing Vitals:   08/03/16 2330 08/03/16 2345 08/04/16 0000 08/04/16 0100  BP: 130/66 (!) 156/72 131/79 128/60  Pulse: 79 86 82 75  Resp: 18 (!) 23 18 20   Temp:      TempSrc:      SpO2: 97% 97% 97% 97%   Eyes: PERRL, lids and conjunctivae normal ENMT: Mucous membranes are moist. Posterior pharynx clear of any exudate or lesions. Normal dentition.  Neck: normal appearance, supple, no masses Respiratory: clear to auscultation bilaterally, no wheezing, no crackles. Normal respiratory effort. No accessory muscle use.  Cardiovascular: Normal rate, regular rhythm, no murmurs / rubs / gallops. No extremity edema. 2+ pedal pulses. No carotid bruits.  GI: abdomen is soft and compressible.  No distention.  No tenderness.  Bowel sounds are present. Musculoskeletal:  No joint deformity in upper and lower extremities. Good ROM, no contractures. Normal muscle tone.  Skin: no rashes, warm and dry Neurologic: CN 2-12 grossly intact. Sensation intact, Strength symmetric bilaterally.  No pronator drift.   Psychiatric: Awake and alert.  Her children translate for her.  Response seems appropriate.      Labs on Admission: I have personally reviewed following labs and imaging studies  CBC:  Recent Labs Lab 08/03/16 1943  WBC 8.2  HGB 11.4*  HCT 36.5  MCV 71.9*  PLT 102*   Basic Metabolic Panel:  Recent Labs Lab 08/03/16 1943  NA 135  K 4.0  CL 102  CO2 23  GLUCOSE 119*  BUN 12  CREATININE 0.59  CALCIUM 9.4   GFR: CrCl cannot be calculated (Unknown ideal weight.).  Cardiac Enzymes: Troponin negative  Urine analysis:    Component Value Date/Time   COLORURINE STRAW (A) 08/03/2016 1930   APPEARANCEUR  CLEAR 08/03/2016 1930   LABSPEC 1.006 08/03/2016 1930   PHURINE 8.0 08/03/2016 1930   GLUCOSEU NEGATIVE 08/03/2016 1930   HGBUR NEGATIVE 08/03/2016 1930   BILIRUBINUR NEGATIVE 08/03/2016 1930   KETONESUR NEGATIVE 08/03/2016 1930   PROTEINUR NEGATIVE 08/03/2016 1930   NITRITE NEGATIVE 08/03/2016 1930   LEUKOCYTESUR NEGATIVE 08/03/2016 1930    Radiological Exams on Admission: Dg Chest 2 View  Result Date: 08/03/2016 CLINICAL DATA:  Chest pain nausea and dizziness EXAM: CHEST  2 VIEW COMPARISON:  None. FINDINGS: Mildly low lung volumes. No focal consolidation or pleural effusion. Heart size upper normal. Mildly tortuous aorta with atherosclerosis. No pneumothorax. Possible of hiatal hernia. Osteopenia. Kyphosis of the spine with severe compression fracture of lower thoracic vertebra and multiple compression deformities of upper lumbar vertebra. IMPRESSION: 1. No acute infiltrate or  edema 2. Osteopenia with multiple moderate severe compression fractures of the lower thoracic and upper lumbar spine. Electronically Signed   By: Jasmine Pang M.D.   On: 08/03/2016 23:26    EKG: Independently reviewed. NSR.  No acute ST segment changes.  Assessment/Plan Principal Problem:   Chest pain Active Problems:   Dizziness   Tenosynovitis of finger   Osteoporosis   Compression fracture of thoracic spine, non-traumatic (HCC)   Diabetes (HCC)   Thrombocytopenia (HCC)   Anemia      Chest pain with typical and atypical features.  Heart score of five. --Telemetry monitoring --Serial troponin --Complete echo in the AM --NPO after 4AM in the event she needs a stress test. --Aspirin refused.  Continue nitroglycerin ointment.  Dizziness and headache.  Not clear if directly related to chest pain.  No focal deficits on exam. --Head CT deferred for now --Check carotid ultrasounds bilaterally --Check sed rate and CRP but I think temporal arteritis unlikely without vision disturbance  Joint pains,  likely combination of arthritis and osteoporosis.  It sounds like she on a bisphosphonate in [place]. --Analgesics as needed. --No fever; unlikely viral --Check vitamin D level  Probable tenosynovitis of 4th digit on left hand --Swelling unremarkable now --ROM improved from earlier today --Supportive care for now  Chronic thrombocytopenia --Follow trend --Avoid anticoagulants --continue home dose of prednisone  Anemia, chronic, baseline unknown --Anemia panel  Compression fractures --Analgesics as needed  DM --Hold metformin and glimepiride. --SSI insulin coverage with meals.  DVT prophylaxis: Low risk, outpatient status.  Anticoagulants avoided due to thrombocytopenia Code Status: FULL Family Communication: Two children at bedside in the ED at time of admission. Disposition Plan: Expect she will go home at discharge. Consults called: NONE Admission status: Place in observation, telemetry monitoring.   TIME SPENT: 60 minutes   Jerene Bears MD [facility] Pager [phone]  If 7PM-7AM, please contact night-coverage [email or web address] Password [email or web address]  08/04/2016, 1:43 AM

## 2016-08-04 NOTE — ED Notes (Signed)
Pt oob to bedside commode

## 2016-08-04 NOTE — ED Notes (Signed)
Pt not c/o Chest pain

## 2016-08-04 NOTE — ED Notes (Signed)
Pt not currently c/o chest pain.

## 2016-08-04 NOTE — ED Notes (Signed)
Pt through son states she continues to have lessening itching and denies chest pain.

## 2016-08-04 NOTE — Progress Notes (Signed)
I minute VS

## 2016-08-04 NOTE — ED Notes (Addendum)
Dr. Jarvis NewcomerGrunz contacted and informed of itching. Son at bedside and pt states she has lessening itching. MD informed of same and states to hold as itching will probably decrease. Family and pt informed of same.

## 2016-08-04 NOTE — ED Notes (Signed)
Pt denies chest pain or itching.

## 2016-08-04 NOTE — Progress Notes (Signed)
  PROGRESS NOTE  Kim Larson  [id number] DOB: [birth year] DOA: 08/03/2016 PCP: In [place]  Brief Narrative: Kim Larson is a 66 y.o. 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 Chest pain is described as though she's being "punched in the chest" is slightly left of center, nonradiating, worse with exertion, was better with NTG given in ED, and has been occurring intermittently for several months. She also reported intermittent dizziness, generalized weakness and left-sided headache.   On presentation, ECG showed NSR without ST changes, negative d-dimer, initial troponin negative. Mild microcytic anemia and thrombocytopenia without leukocytosis on labs. CXR shows vertebral compression fractures which the patient says are chronic without cardiac/pulmonary findings. She was admitted earlier this morning for chest pain rule out.   Subjective: Chest pain improved with nitropaste. No current complaints.   Objective: BP 121/62   Pulse 70   Temp 99.6 F (37.6 C) (Oral)   Resp 15   SpO2 96%  BP 121/62   Pulse 70   Temp 99.6 F (37.6 C) (Oral)   Resp 15   SpO2 96%  Gen: Calm 66 y.o. female in NAD HEENT: MMM, posterior oropharynx clear, mild conjunctival pallor Pulm: Non-labored; CTAB, no wheezes  CV: Regular rate, no murmur appreciated; distal pulses intact/symmetric. No JVD or LE edema GI: NABS; soft, non-tender, non-distended Skin: No rashes, wounds, ulcers Neuro: A&Ox3, CN II-XII without deficits  Assessment & Plan: Chest pain with typical and atypical features.  Heart score of five. - Initial troponin negative, continue cycling.  - Due to typical features (exertional, relieved by NTG), will ask for cardiology input regarding need for stress testing. Keep NPO - Echo ordered - Morphine, NTG,  oxygen prn - Pt declining ASA due to thrombocytopenia  Dizziness and headache: No focal findings on exam. Deferring imaging.  - Carotid US ordered at admission.   - ESR pending (low suspicion for GCA)  ITP: Not critically low - Continue prednisone - Monitor in AM  Microcytic anemia: With iron deficiency and vitamin B12 deficiency on work up thus far - Will give IV iron and start po iron, start B12 - Will need recheck at follow up when she returns home  Osteoporosis with chronic vertebral compression fractures: Pt on probable bisphosphonate (infusion q3 months) and calcium. RF: age, sex, PPI, chronic prednisone. - Continue calcium - Tylenol prn  Osteoarthritis: Stable - Tylenol prn as above, supportive therapy  GERD:  - Continuing PPI, would strongly consider [place] this given osteoporosis and vitamin deficiency  DM: - Hold metformin, glimepiride - CBGs and SSI   Vance Gather, MD [facility] Pager [phone]

## 2016-08-05 LAB — VITAMIN D 25 HYDROXY (VIT D DEFICIENCY, FRACTURES): Vit D, 25-Hydroxy: 33.5 ng/mL (ref 30.0–100.0)

## 2016-08-05 LAB — GLUCOSE, CAPILLARY: Glucose-Capillary: 89 mg/dL (ref 65–99)

## 2016-08-05 NOTE — Discharge Summary (Signed)
Physician Discharge Summary  Kim Larson [id number] DOB: 07-Feb-1951 DOA: 08/03/2016  PCP: In [place]  Admit date: 08/03/2016 Discharge date: 08/05/2016  Admitted From: Home Disposition: Home   Recommendations for Outpatient Follow-up:  1. Follow up with PCP once returned home. 2. Monitor CBC, diagnosed iron deficiency anemia. Gave IV iron and started po iron. See data below. 3. 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 Chest pain is described as though she's being "punched in the chest" is slightly left of center, nonradiating, worse with exertion, was better with NTG given in ED, and has been occurring intermittently for several months. She also reported intermittent dizziness, generalized weakness and left-sided headache.   On presentation, ECG showed NSR without ST changes, negative d-dimer, initial troponin negative. Mild microcytic anemia and thrombocytopenia without leukocytosis on labs. CXR shows vertebral compression fractures which the patient says are chronic without cardiac/pulmonary findings. She was admitted for chest pain rule out. Troponins remained negative. Nuclear stress test was initially thought to be intermediate risk, though, on review by Dr. Einar Gip, this is thought to be a low risk study with no reducible ischemia and no wall motion abnormalities. She was given IV iron for iron deficiency, and started on both B12 and iron supplements at discharge. She will follow up with her PCP back home.   Discharge Diagnoses:  Principal  Problem:   Chest pain Active Problems:   Dizziness   Tenosynovitis of finger   Osteoporosis   Compression fracture of thoracic spine, non-traumatic (HCC)   Diabetes (HCC)   Thrombocytopenia (HCC)   Anemia  Dizziness and headache: No focal findings on exam. Deferring imaging. CRP negative, ESR 26.   ITP: Not critically low - Continue prednisone - Monitor in AM  Microcytic anemia: With iron deficiency and vitamin B12 deficiency on work up thus far - Will give IV iron and start po iron, start B12 - Will need recheck at follow up when she returns home  Osteoporosis with chronic vertebral compression fractures: Pt on probable bisphosphonate (infusion q3 months) and calcium. RF: age, sex, PPI, chronic prednisone. - Continue calcium - Tylenol prn  Osteoarthritis: Stable - Tylenol prn as above, supportive therapy  GERD:  - Continuing PPI, would strongly consider [place] this given osteoporosis and vitamin deficiency  DM: - Restart home medications   Discharge Instructions Discharge Instructions    Diet - low sodium heart healthy    Complete by:  As directed      Allergies as of 08/04/2016   No Known Allergies     Medication List    TAKE these medications   calcium carbonate 500 MG chewable tablet Commonly known as:  TUMS - dosed in mg elemental calcium Chew 1 tablet by mouth 2 (two) times daily.   ferrous sulfate 325 (65 FE) MG tablet Take 1 tablet (325 mg total) by mouth daily.   GLIMEPIRIDE PO Take 1.5 mg by mouth daily.   metFORMIN 850 MG tablet Commonly known as:  GLUCOPHAGE Take 850 mg by mouth 2 (two) times daily with a meal.   omeprazole 20 MG capsule Commonly known as:  PRILOSEC Take 20 mg by  mouth daily.   predniSONE 5 MG tablet Commonly known as:  DELTASONE Take 5 mg by mouth daily with breakfast.   PRESCRIPTION MEDICATION Take 5 mLs by mouth 2 (two) times daily. [place] 15%   vitamin B-12 1000 MCG tablet Commonly known as:   CYANOCOBALAMIN Take 1 tablet (1,000 mcg total) by mouth daily.       No Known Allergies  Consultations:  Cardiology, Dr. Einar Gip  Procedures/Studies: Dg Chest 2 View  Result Date: 08/03/2016 CLINICAL DATA:  Chest pain nausea and dizziness EXAM: CHEST  2 VIEW COMPARISON:  None. FINDINGS: Mildly low lung volumes. No focal consolidation or pleural effusion. Heart size upper normal. Mildly tortuous aorta with atherosclerosis. No pneumothorax. Possible of hiatal hernia. Osteopenia. Kyphosis of the spine with severe compression fracture of lower thoracic vertebra and multiple compression deformities of upper lumbar vertebra. IMPRESSION: 1. No acute infiltrate or edema 2. Osteopenia with multiple moderate severe compression fractures of the lower thoracic and upper lumbar spine. Electronically Signed   By: Donavan Foil M.D.   On: 08/03/2016 23:26   Nm Myocar Multi W/spect W/wall Motion / Ef  Result Date: 08/04/2016 CLINICAL DATA:  Chest pain.  Diabetes.  Shortness of breath. EXAM: MYOCARDIAL IMAGING WITH SPECT (REST AND PHARMACOLOGIC-STRESS) GATED LEFT VENTRICULAR WALL MOTION STUDY LEFT VENTRICULAR EJECTION FRACTION TECHNIQUE: Standard myocardial SPECT imaging was performed after resting intravenous injection of th mCi Tc-60mtetrofosmin. Subsequently, intravenous infusion of Lexiscan was performed under the supervision of the Cardiology staff. At peak effect of the drug, 30 mCi Tc-94metrofosmin was injected intravenously and standard myocardial SPECT imaging was performed. Quantitative gated imaging was also performed to evaluate left ventricular wall motion, and estimate left ventricular ejection fraction. COMPARISON:  Chest radiograph the 08/03/2016. FINDINGS: Perfusion: No rest defect. With stress, there is an area of moderate severity, medium size apparent reversibility within the apical segment anteroseptal wall. Wall Motion: Global hypokinesis.  No focal abnormality. Left Ventricular Ejection  Fraction: 53 % End diastolic volume 97 ml End systolic volume 46 ml IMPRESSION: 1. Area of reversibility involving the apical segment of the anteroseptal wall, suspicious for inducible ischemia. 2. Mild global hypokinesis. 3. Left ventricular ejection fraction 53% 4. Non invasive risk stratification*: Intermediate *2012 Appropriate Use Criteria for Coronary Revascularization Focused Update: J Am Coll Cardiol. 206160;73(7):[email or web address] Electronically Signed   By: KyAbigail Miyamoto.D.   On: 08/04/2016 17:18   Per Dr. GaEinar Gip"I have personally reviewed the nuclear images, patient has very mild soft tissue attenuation artifact in the anterior wall, gut uptake artifact in the inferior wall, I do not see any reversibility. There is no wall motion abnormality on gated images. Hence I consider this to be a low risk study. Patient can be discharged home with outpatient follow-up and follow-up with her physicians in [place] the earliest. Discussed with Dr. GrBonner Puna  Subjective: No complaints. No chest pain, palpitations, dyspnea. Tolerated IV iron.   Discharge Exam:  BP (!) 146/81 Comment: post Lexiscan  Pulse (!) 102   Temp 98.9 F (37.2 C)   Resp 13   SpO2 96%   General: Pt is alert, awake, not in acute distress Cardiovascular: RRR, S1/S2 +, no rubs, no gallops Respiratory: CTA bilaterally, no wheezing, no rhonchi Abdominal: Soft, NT, ND, bowel sounds + Extremities: No edema, no cyanosis  Labs: Basic Metabolic Panel:  Recent Labs Lab 08/03/16 1943  NA 135  K 4.0  CL 102  CO2 23  GLUCOSE 119*  BUN 12  CREATININE  0.59  CALCIUM 9.4   Liver Function Tests:  Recent Labs Lab 08/04/16 0152  AST 20  ALT 13*  ALKPHOS 46  BILITOT 0.6  PROT 6.6  ALBUMIN 3.6   CBC:  Recent Labs Lab 08/03/16 1943  WBC 8.2  HGB 11.4*  HCT 36.5  MCV 71.9*  PLT 102*   Cardiac Enzymes:  Recent Labs Lab 08/04/16 0152 08/04/16 0723  08/04/16 1338  TROPONINI <0.03 <0.03 <0.03   CBG:  Recent Labs Lab 08/04/16 0730 08/04/16 1503 08/04/16 1703  GLUCAP 94 89 121*   D-Dimer  Recent Labs  08/03/16 2350  DDIMER 0.45   Lipid Profile  Recent Labs  08/04/16 0723  CHOL 143  HDL 37*  LDLCALC 81  TRIG 123  CHOLHDL 3.9   Thyroid function studies  Recent Labs  08/04/16 0152  TSH 0.774   Anemia work up  Recent Labs  08/04/16 0152  VITAMINB12 146*  FOLATE 20.0  FERRITIN 7*  TIBC 381  IRON 25*  RETICCTPCT 0.9   Urinalysis    Component Value Date/Time   COLORURINE STRAW (A) 08/03/2016 1930   APPEARANCEUR CLEAR 08/03/2016 1930   LABSPEC 1.006 08/03/2016 1930   PHURINE 8.0 08/03/2016 1930   GLUCOSEU NEGATIVE 08/03/2016 1930   HGBUR NEGATIVE 08/03/2016 1930   [place] NEGATIVE 08/03/2016 [place] NEGATIVE 08/03/2016 1930   PROTEINUR NEGATIVE 08/03/2016 1930   NITRITE NEGATIVE 08/03/2016 1930   LEUKOCYTESUR NEGATIVE 08/03/2016 1930   Time coordinating discharge: Approximately 40 minutes  Vance Gather, MD  [facility] 08/05/2016, 3:13 PM Pager [phone]
